# Patient Record
Sex: Female | Born: 1980 | Race: White | Hispanic: No | Marital: Married | Smoking: Never smoker
Health system: Southern US, Community
[De-identification: ages and names within clinical notes are randomized; demographics above are authoritative.]

## PROBLEM LIST (undated history)

## (undated) DIAGNOSIS — G40909 Epilepsy, unspecified, not intractable, without status epilepticus: Secondary | ICD-10-CM

## (undated) DIAGNOSIS — G43909 Migraine, unspecified, not intractable, without status migrainosus: Secondary | ICD-10-CM

## (undated) HISTORY — DX: Epilepsy, unspecified, not intractable, without status epilepticus: G40.909

## (undated) HISTORY — PX: NO PAST SURGERIES: SHX2092

## (undated) HISTORY — DX: Migraine, unspecified, not intractable, without status migrainosus: G43.909

---

## 2011-05-16 VITALS — BP 120/80 | HR 64 | Temp 98.7°F | Ht 62.5 in | Wt 160.0 lb

## 2011-05-16 DIAGNOSIS — Z0000 Encounter for general adult medical examination without abnormal findings: Secondary | ICD-10-CM

## 2011-05-16 DIAGNOSIS — Z136 Encounter for screening for cardiovascular disorders: Secondary | ICD-10-CM

## 2011-05-16 DIAGNOSIS — Z1159 Encounter for screening for other viral diseases: Secondary | ICD-10-CM | POA: Insufficient documentation

## 2011-05-16 DIAGNOSIS — G43909 Migraine, unspecified, not intractable, without status migrainosus: Secondary | ICD-10-CM | POA: Insufficient documentation

## 2011-05-16 DIAGNOSIS — Z01419 Encounter for gynecological examination (general) (routine) without abnormal findings: Secondary | ICD-10-CM | POA: Insufficient documentation

## 2011-05-16 DIAGNOSIS — G40909 Epilepsy, unspecified, not intractable, without status epilepticus: Secondary | ICD-10-CM | POA: Insufficient documentation

## 2011-05-16 DIAGNOSIS — Z8669 Personal history of other diseases of the nervous system and sense organs: Secondary | ICD-10-CM | POA: Insufficient documentation

## 2011-05-16 MED ORDER — RIZATRIPTAN BENZOATE 10 MG PO TABS: 10.0000 mg | ORAL_TABLET | Freq: Once | ORAL | Status: DC | PRN

## 2011-05-16 MED ORDER — LEVONORGESTREL-ETHINYL ESTRAD 0.1-20 MG-MCG PO TABS: 1.0000 | ORAL_TABLET | Freq: Every day | ORAL | Status: DC

## 2012-05-19 VITALS — BP 122/70 | HR 68 | Temp 97.9°F | Ht 62.5 in | Wt 159.0 lb

## 2012-05-19 DIAGNOSIS — Z0000 Encounter for general adult medical examination without abnormal findings: Secondary | ICD-10-CM

## 2012-05-19 DIAGNOSIS — G43909 Migraine, unspecified, not intractable, without status migrainosus: Secondary | ICD-10-CM

## 2012-05-19 DIAGNOSIS — Z136 Encounter for screening for cardiovascular disorders: Secondary | ICD-10-CM

## 2012-05-19 DIAGNOSIS — G40909 Epilepsy, unspecified, not intractable, without status epilepticus: Secondary | ICD-10-CM

## 2012-10-02 VITALS — BP 122/80 | HR 80 | Temp 98.8°F | Wt 162.0 lb

## 2012-10-02 DIAGNOSIS — Z3490 Encounter for supervision of normal pregnancy, unspecified, unspecified trimester: Secondary | ICD-10-CM | POA: Insufficient documentation

## 2012-10-02 DIAGNOSIS — N912 Amenorrhea, unspecified: Secondary | ICD-10-CM

## 2013-05-20 DIAGNOSIS — O4290 Premature rupture of membranes, unspecified as to length of time between rupture and onset of labor, unspecified weeks of gestation: Principal | ICD-10-CM | POA: Diagnosis present

## 2013-05-20 MED ORDER — OXYTOCIN 40 UNITS IN LACTATED RINGERS INFUSION - SIMPLE MED: 62.5000 mL/h | INTRAVENOUS | Status: DC

## 2013-05-20 MED ORDER — LACTATED RINGERS IV SOLN
500.0000 mL | Freq: Once | INTRAVENOUS | Status: AC
  Administered 2013-05-20: 500 mL via INTRAVENOUS

## 2013-05-20 MED ORDER — PHENYLEPHRINE 40 MCG/ML (10ML) SYRINGE FOR IV PUSH (FOR BLOOD PRESSURE SUPPORT): 80.0000 ug | PREFILLED_SYRINGE | INTRAVENOUS | Status: DC | PRN

## 2013-05-20 MED ORDER — ACETAMINOPHEN 325 MG PO TABS: 650.0000 mg | ORAL_TABLET | ORAL | Status: DC | PRN

## 2013-05-20 MED ORDER — SODIUM BICARBONATE 8.4 % IV SOLN
INTRAVENOUS | Status: DC | PRN
  Administered 2013-05-20: 5 mL via EPIDURAL

## 2013-05-20 MED ORDER — CITRIC ACID-SODIUM CITRATE 334-500 MG/5ML PO SOLN: 30.0000 mL | ORAL | Status: DC | PRN

## 2013-05-20 MED ORDER — FENTANYL 2.5 MCG/ML BUPIVACAINE 1/10 % EPIDURAL INFUSION (WH - ANES)
14.0000 mL/h | INTRAMUSCULAR | Status: DC | PRN
  Administered 2013-05-20: 14 mL/h via EPIDURAL

## 2013-05-20 MED ORDER — LACTATED RINGERS IV SOLN
INTRAVENOUS | Status: DC
  Administered 2013-05-20: 19:00:00 via INTRAVENOUS

## 2013-05-20 MED ORDER — LIDOCAINE HCL (PF) 1 % IJ SOLN
30.0000 mL | INTRAMUSCULAR | Status: DC | PRN
  Administered 2013-05-21: 30 mL via SUBCUTANEOUS

## 2013-05-20 MED ORDER — EPHEDRINE 5 MG/ML INJ: 10.0000 mg | INTRAVENOUS | Status: DC | PRN

## 2013-05-20 MED ORDER — LACTATED RINGERS IV SOLN: 500.0000 mL | INTRAVENOUS | Status: DC | PRN

## 2013-05-20 MED ORDER — TERBUTALINE SULFATE 1 MG/ML IJ SOLN: 0.2500 mg | Freq: Once | INTRAMUSCULAR | Status: AC | PRN

## 2013-05-20 MED ORDER — OXYCODONE-ACETAMINOPHEN 5-325 MG PO TABS: 1.0000 | ORAL_TABLET | ORAL | Status: DC | PRN

## 2013-05-20 MED ORDER — ONDANSETRON HCL 4 MG/2ML IJ SOLN: 4.0000 mg | Freq: Four times a day (QID) | INTRAMUSCULAR | Status: DC | PRN

## 2013-05-20 MED ORDER — IBUPROFEN 600 MG PO TABS: 600.0000 mg | ORAL_TABLET | Freq: Four times a day (QID) | ORAL | Status: DC | PRN

## 2013-05-20 MED ORDER — OXYTOCIN 40 UNITS IN LACTATED RINGERS INFUSION - SIMPLE MED: INTRAVENOUS | Status: AC

## 2013-05-20 MED ORDER — OXYTOCIN BOLUS FROM INFUSION
500.0000 mL | INTRAVENOUS | Status: DC
  Administered 2013-05-21: 500 mL via INTRAVENOUS

## 2013-05-20 MED ORDER — BUTORPHANOL TARTRATE 1 MG/ML IJ SOLN
1.0000 mg | INTRAMUSCULAR | Status: DC | PRN
  Administered 2013-05-20: 1 mg via INTRAVENOUS

## 2013-05-20 MED ORDER — OXYTOCIN 40 UNITS IN LACTATED RINGERS INFUSION - SIMPLE MED
1.0000 m[IU]/min | INTRAVENOUS | Status: DC
  Administered 2013-05-20: 2 m[IU]/min via INTRAVENOUS

## 2013-05-20 MED ORDER — DIPHENHYDRAMINE HCL 50 MG/ML IJ SOLN: 12.5000 mg | INTRAMUSCULAR | Status: DC | PRN

## 2013-05-21 MED ORDER — DIPHENHYDRAMINE HCL 25 MG PO CAPS: 25.0000 mg | ORAL_CAPSULE | Freq: Four times a day (QID) | ORAL | Status: DC | PRN

## 2013-05-21 MED ORDER — SENNOSIDES-DOCUSATE SODIUM 8.6-50 MG PO TABS
2.0000 | ORAL_TABLET | Freq: Every day | ORAL | Status: DC
  Administered 2013-05-22 (×2): 2 via ORAL

## 2013-05-21 MED ORDER — IBUPROFEN 600 MG PO TABS
600.0000 mg | ORAL_TABLET | Freq: Four times a day (QID) | ORAL | Status: DC
  Administered 2013-05-21 – 2013-05-23 (×10): 600 mg via ORAL

## 2013-05-21 MED ORDER — ONDANSETRON HCL 4 MG/2ML IJ SOLN: 4.0000 mg | INTRAMUSCULAR | Status: DC | PRN

## 2013-05-21 MED ORDER — MEASLES, MUMPS & RUBELLA VAC ~~LOC~~ INJ
0.5000 mL | INJECTION | Freq: Once | SUBCUTANEOUS | Status: AC
  Administered 2013-05-22: 0.5 mL via SUBCUTANEOUS

## 2013-05-21 MED ORDER — SIMETHICONE 80 MG PO CHEW: 80.0000 mg | CHEWABLE_TABLET | ORAL | Status: DC | PRN

## 2013-05-21 MED ORDER — BENZOCAINE-MENTHOL 20-0.5 % EX AERO
1.0000 "application " | INHALATION_SPRAY | CUTANEOUS | Status: DC | PRN
  Administered 2013-05-21: 1 via TOPICAL

## 2013-05-21 MED ORDER — MEDROXYPROGESTERONE ACETATE 150 MG/ML IM SUSP: 150.0000 mg | INTRAMUSCULAR | Status: DC | PRN

## 2013-05-21 MED ORDER — DIBUCAINE 1 % RE OINT
1.0000 "application " | TOPICAL_OINTMENT | RECTAL | Status: DC | PRN
  Administered 2013-05-21: 1 via RECTAL

## 2013-05-21 MED ORDER — LANOLIN HYDROUS EX OINT: TOPICAL_OINTMENT | CUTANEOUS | Status: DC | PRN

## 2013-05-21 MED ORDER — OXYCODONE-ACETAMINOPHEN 5-325 MG PO TABS: 1.0000 | ORAL_TABLET | ORAL | Status: DC | PRN

## 2013-05-21 MED ORDER — ONDANSETRON HCL 4 MG PO TABS: 4.0000 mg | ORAL_TABLET | ORAL | Status: DC | PRN

## 2013-05-21 MED ORDER — WITCH HAZEL-GLYCERIN EX PADS: 1.0000 "application " | MEDICATED_PAD | CUTANEOUS | Status: DC | PRN

## 2013-05-21 MED ORDER — TETANUS-DIPHTH-ACELL PERTUSSIS 5-2.5-18.5 LF-MCG/0.5 IM SUSP: 0.5000 mL | Freq: Once | INTRAMUSCULAR | Status: DC

## 2013-05-21 MED ORDER — PRENATAL MULTIVITAMIN CH
1.0000 | ORAL_TABLET | Freq: Every day | ORAL | Status: DC
  Administered 2013-05-21 – 2013-05-23 (×3): 1 via ORAL

## 2013-10-10 VITALS — BP 116/70 | HR 76 | Temp 98.8°F | Resp 18 | Wt 148.0 lb

## 2013-10-10 DIAGNOSIS — R112 Nausea with vomiting, unspecified: Secondary | ICD-10-CM | POA: Insufficient documentation

## 2013-10-10 MED ORDER — DIPHENOXYLATE-ATROPINE 2.5-0.025 MG PO TABS: 1.0000 | ORAL_TABLET | Freq: Four times a day (QID) | ORAL | Status: DC | PRN

## 2013-10-10 MED ORDER — ONDANSETRON HCL 4 MG PO TABS: 4.0000 mg | ORAL_TABLET | Freq: Three times a day (TID) | ORAL | Status: DC | PRN

## 2013-10-10 MED ORDER — OSELTAMIVIR PHOSPHATE 75 MG PO CAPS: 75.0000 mg | ORAL_CAPSULE | Freq: Two times a day (BID) | ORAL | Status: DC

## 2014-12-17 DIAGNOSIS — Z3490 Encounter for supervision of normal pregnancy, unspecified, unspecified trimester: Secondary | ICD-10-CM

## 2014-12-17 DIAGNOSIS — Z3A40 40 weeks gestation of pregnancy: Secondary | ICD-10-CM | POA: Diagnosis present

## 2014-12-17 DIAGNOSIS — G40909 Epilepsy, unspecified, not intractable, without status epilepticus: Secondary | ICD-10-CM | POA: Diagnosis present

## 2014-12-17 DIAGNOSIS — O99354 Diseases of the nervous system complicating childbirth: Secondary | ICD-10-CM | POA: Diagnosis present

## 2014-12-17 MED ORDER — MEDROXYPROGESTERONE ACETATE 150 MG/ML IM SUSP: 150.0000 mg | INTRAMUSCULAR | Status: DC | PRN

## 2014-12-17 MED ORDER — MEASLES, MUMPS & RUBELLA VAC ~~LOC~~ INJ: 0.5000 mL | INJECTION | Freq: Once | SUBCUTANEOUS | Status: DC

## 2014-12-17 MED ORDER — OXYCODONE-ACETAMINOPHEN 5-325 MG PO TABS: 2.0000 | ORAL_TABLET | ORAL | Status: DC | PRN

## 2014-12-17 MED ORDER — SENNOSIDES-DOCUSATE SODIUM 8.6-50 MG PO TABS
2.0000 | ORAL_TABLET | ORAL | Status: DC
  Administered 2014-12-17 – 2014-12-18 (×2): 2 via ORAL

## 2014-12-17 MED ORDER — TETANUS-DIPHTH-ACELL PERTUSSIS 5-2.5-18.5 LF-MCG/0.5 IM SUSP: 0.5000 mL | Freq: Once | INTRAMUSCULAR | Status: DC

## 2014-12-17 MED ORDER — CITRIC ACID-SODIUM CITRATE 334-500 MG/5ML PO SOLN: 30.0000 mL | ORAL | Status: DC | PRN

## 2014-12-17 MED ORDER — ZOLPIDEM TARTRATE 5 MG PO TABS: 5.0000 mg | ORAL_TABLET | Freq: Every evening | ORAL | Status: DC | PRN

## 2014-12-17 MED ORDER — OXYCODONE-ACETAMINOPHEN 5-325 MG PO TABS: 1.0000 | ORAL_TABLET | ORAL | Status: DC | PRN

## 2014-12-17 MED ORDER — EPHEDRINE 5 MG/ML INJ: 10.0000 mg | INTRAVENOUS | Status: DC | PRN

## 2014-12-17 MED ORDER — LACTATED RINGERS IV SOLN: 500.0000 mL | Freq: Once | INTRAVENOUS | Status: DC

## 2014-12-17 MED ORDER — IBUPROFEN 600 MG PO TABS
600.0000 mg | ORAL_TABLET | Freq: Four times a day (QID) | ORAL | Status: DC
  Administered 2014-12-17 – 2014-12-19 (×8): 600 mg via ORAL

## 2014-12-17 MED ORDER — SIMETHICONE 80 MG PO CHEW: 80.0000 mg | CHEWABLE_TABLET | ORAL | Status: DC | PRN

## 2014-12-17 MED ORDER — DIPHENHYDRAMINE HCL 50 MG/ML IJ SOLN: 12.5000 mg | INTRAMUSCULAR | Status: DC | PRN

## 2014-12-17 MED ORDER — ONDANSETRON HCL 4 MG/2ML IJ SOLN: 4.0000 mg | Freq: Four times a day (QID) | INTRAMUSCULAR | Status: DC | PRN

## 2014-12-17 MED ORDER — ONDANSETRON HCL 4 MG/2ML IJ SOLN: 4.0000 mg | INTRAMUSCULAR | Status: DC | PRN

## 2014-12-17 MED ORDER — TERBUTALINE SULFATE 1 MG/ML IJ SOLN: 0.2500 mg | Freq: Once | INTRAMUSCULAR | Status: DC | PRN

## 2014-12-17 MED ORDER — PRENATAL MULTIVITAMIN CH
1.0000 | ORAL_TABLET | Freq: Every day | ORAL | Status: DC
  Administered 2014-12-18 – 2014-12-19 (×2): 1 via ORAL

## 2014-12-17 MED ORDER — ACETAMINOPHEN 325 MG PO TABS: 650.0000 mg | ORAL_TABLET | ORAL | Status: DC | PRN

## 2014-12-17 MED ORDER — LACTATED RINGERS IV SOLN: 500.0000 mL | INTRAVENOUS | Status: DC | PRN

## 2014-12-17 MED ORDER — FENTANYL 2.5 MCG/ML BUPIVACAINE 1/10 % EPIDURAL INFUSION (WH - ANES)
INTRAMUSCULAR | Status: DC | PRN
  Administered 2014-12-17: 14 mL/h via EPIDURAL

## 2014-12-17 MED ORDER — PHENYLEPHRINE 40 MCG/ML (10ML) SYRINGE FOR IV PUSH (FOR BLOOD PRESSURE SUPPORT): 80.0000 ug | PREFILLED_SYRINGE | INTRAVENOUS | Status: DC | PRN

## 2014-12-17 MED ORDER — DIPHENHYDRAMINE HCL 25 MG PO CAPS: 25.0000 mg | ORAL_CAPSULE | Freq: Four times a day (QID) | ORAL | Status: DC | PRN

## 2014-12-17 MED ORDER — OXYTOCIN BOLUS FROM INFUSION: 500.0000 mL | INTRAVENOUS | Status: DC

## 2014-12-17 MED ORDER — LACTATED RINGERS IV SOLN: INTRAVENOUS | Status: DC

## 2014-12-17 MED ORDER — OXYTOCIN BOLUS FROM INFUSION
500.0000 mL | INTRAVENOUS | Status: DC
  Administered 2014-12-17: 500 mL via INTRAVENOUS

## 2014-12-17 MED ORDER — BUTORPHANOL TARTRATE 1 MG/ML IJ SOLN: 1.0000 mg | INTRAMUSCULAR | Status: DC | PRN

## 2014-12-17 MED ORDER — LIDOCAINE HCL (PF) 1 % IJ SOLN: 30.0000 mL | INTRAMUSCULAR | Status: DC | PRN

## 2014-12-17 MED ORDER — OXYTOCIN 40 UNITS IN LACTATED RINGERS INFUSION - SIMPLE MED
1.0000 m[IU]/min | INTRAVENOUS | Status: DC
  Administered 2014-12-17: 2 m[IU]/min via INTRAVENOUS

## 2014-12-17 MED ORDER — LACTATED RINGERS IV SOLN
INTRAVENOUS | Status: DC
  Administered 2014-12-17: 08:00:00 via INTRAVENOUS

## 2014-12-17 MED ORDER — OXYTOCIN 40 UNITS IN LACTATED RINGERS INFUSION - SIMPLE MED: 62.5000 mL/h | INTRAVENOUS | Status: DC

## 2014-12-17 MED ORDER — ONDANSETRON HCL 4 MG/2ML IJ SOLN
4.0000 mg | Freq: Four times a day (QID) | INTRAMUSCULAR | Status: DC | PRN
  Administered 2014-12-17: 4 mg via INTRAVENOUS

## 2014-12-17 MED ORDER — ONDANSETRON HCL 4 MG PO TABS: 4.0000 mg | ORAL_TABLET | ORAL | Status: DC | PRN

## 2014-12-17 MED ORDER — WITCH HAZEL-GLYCERIN EX PADS
1.0000 "application " | MEDICATED_PAD | CUTANEOUS | Status: DC | PRN
  Administered 2014-12-17: 1 via TOPICAL

## 2014-12-17 MED ORDER — PHENYLEPHRINE 40 MCG/ML (10ML) SYRINGE FOR IV PUSH (FOR BLOOD PRESSURE SUPPORT)
80.0000 ug | PREFILLED_SYRINGE | INTRAVENOUS | Status: DC | PRN
  Administered 2014-12-17: 80 ug via INTRAVENOUS

## 2014-12-17 MED ORDER — BENZOCAINE-MENTHOL 20-0.5 % EX AERO
1.0000 "application " | INHALATION_SPRAY | CUTANEOUS | Status: DC | PRN
  Administered 2014-12-17: 1 via TOPICAL

## 2014-12-17 MED ORDER — DIBUCAINE 1 % RE OINT: 1.0000 "application " | TOPICAL_OINTMENT | RECTAL | Status: DC | PRN

## 2014-12-17 MED ADMIN — Lidocaine HCl Local Preservative Free (PF) Inj 1%: 4 mL | NDC 00409427902

## 2015-04-14 VITALS — BP 126/72 | HR 58 | Temp 97.8°F | Ht 62.0 in | Wt 153.2 lb

## 2015-04-14 DIAGNOSIS — Z0000 Encounter for general adult medical examination without abnormal findings: Secondary | ICD-10-CM

## 2015-11-25 VITALS — BP 120/76 | HR 109 | Temp 99.0°F | Ht 62.0 in | Wt 162.4 lb

## 2015-11-25 DIAGNOSIS — R05 Cough: Secondary | ICD-10-CM | POA: Diagnosis not present

## 2015-11-25 DIAGNOSIS — R059 Cough, unspecified: Secondary | ICD-10-CM

## 2015-11-25 MED ORDER — AZITHROMYCIN 250 MG PO TABS: ORAL_TABLET | ORAL | Status: DC

## 2016-04-17 VITALS — BP 110/66 | HR 70 | Temp 98.3°F | Ht 62.25 in | Wt 167.0 lb

## 2016-04-17 DIAGNOSIS — Z0000 Encounter for general adult medical examination without abnormal findings: Secondary | ICD-10-CM

## 2016-04-17 DIAGNOSIS — G40909 Epilepsy, unspecified, not intractable, without status epilepticus: Secondary | ICD-10-CM | POA: Diagnosis not present

## 2016-09-04 VITALS — BP 112/60 | HR 74 | Temp 98.3°F | Wt 161.5 lb

## 2016-09-04 DIAGNOSIS — J069 Acute upper respiratory infection, unspecified: Secondary | ICD-10-CM | POA: Diagnosis not present

## 2016-09-04 MED ORDER — LEVOCETIRIZINE DIHYDROCHLORIDE 5 MG PO TABS: 5.0000 mg | ORAL_TABLET | Freq: Every evening | ORAL | 3 refills | Status: DC

## 2016-09-04 MED ORDER — AMOXICILLIN-POT CLAVULANATE 875-125 MG PO TABS: 1.0000 | ORAL_TABLET | Freq: Two times a day (BID) | ORAL | 0 refills | Status: AC

## 2016-12-18 VITALS — BP 110/82 | HR 86 | Ht 61.0 in | Wt 159.0 lb

## 2016-12-18 DIAGNOSIS — Z4802 Encounter for removal of sutures: Secondary | ICD-10-CM | POA: Diagnosis not present

## 2016-12-31 DIAGNOSIS — M62838 Other muscle spasm: Secondary | ICD-10-CM | POA: Diagnosis not present

## 2016-12-31 MED ORDER — CYCLOBENZAPRINE HCL 5 MG PO TABS: 5.0000 mg | ORAL_TABLET | Freq: Three times a day (TID) | ORAL | 1 refills | Status: DC | PRN

## 2017-01-03 VITALS — BP 112/74 | HR 78 | Wt 163.2 lb

## 2017-01-03 DIAGNOSIS — R202 Paresthesia of skin: Secondary | ICD-10-CM | POA: Diagnosis not present

## 2017-01-03 DIAGNOSIS — M62838 Other muscle spasm: Secondary | ICD-10-CM

## 2017-01-03 MED ORDER — PREDNISONE 20 MG PO TABS: 40.0000 mg | ORAL_TABLET | Freq: Every day | ORAL | 0 refills | Status: DC

## 2017-04-18 VITALS — BP 100/72 | HR 72 | Ht 63.0 in | Wt 161.0 lb

## 2017-04-18 DIAGNOSIS — N920 Excessive and frequent menstruation with regular cycle: Secondary | ICD-10-CM | POA: Insufficient documentation

## 2017-04-18 DIAGNOSIS — Z113 Encounter for screening for infections with a predominantly sexual mode of transmission: Secondary | ICD-10-CM | POA: Insufficient documentation

## 2017-04-18 DIAGNOSIS — Z124 Encounter for screening for malignant neoplasm of cervix: Secondary | ICD-10-CM | POA: Diagnosis not present

## 2017-04-18 DIAGNOSIS — Z0000 Encounter for general adult medical examination without abnormal findings: Secondary | ICD-10-CM | POA: Diagnosis not present

## 2017-04-18 DIAGNOSIS — N921 Excessive and frequent menstruation with irregular cycle: Secondary | ICD-10-CM

## 2017-04-18 DIAGNOSIS — Z01419 Encounter for gynecological examination (general) (routine) without abnormal findings: Secondary | ICD-10-CM | POA: Insufficient documentation

## 2017-04-18 MED ORDER — LEVONORGESTREL-ETHINYL ESTRAD 0.1-20 MG-MCG PO TABS: 1.0000 | ORAL_TABLET | Freq: Every day | ORAL | 11 refills | Status: DC

## 2017-12-27 VITALS — BP 106/70 | HR 73 | Temp 98.7°F | Wt 163.0 lb

## 2017-12-27 DIAGNOSIS — J302 Other seasonal allergic rhinitis: Secondary | ICD-10-CM

## 2017-12-27 DIAGNOSIS — R17 Unspecified jaundice: Secondary | ICD-10-CM | POA: Diagnosis not present

## 2017-12-27 DIAGNOSIS — N921 Excessive and frequent menstruation with irregular cycle: Secondary | ICD-10-CM | POA: Diagnosis not present

## 2017-12-27 DIAGNOSIS — E7800 Pure hypercholesterolemia, unspecified: Secondary | ICD-10-CM

## 2017-12-27 DIAGNOSIS — Z7689 Persons encountering health services in other specified circumstances: Secondary | ICD-10-CM

## 2018-04-25 VITALS — BP 102/66 | HR 75 | Temp 98.3°F | Ht 62.25 in | Wt 161.0 lb

## 2018-04-25 DIAGNOSIS — E7800 Pure hypercholesterolemia, unspecified: Secondary | ICD-10-CM | POA: Diagnosis not present

## 2018-04-25 DIAGNOSIS — N921 Excessive and frequent menstruation with irregular cycle: Secondary | ICD-10-CM

## 2018-04-25 DIAGNOSIS — R17 Unspecified jaundice: Secondary | ICD-10-CM | POA: Diagnosis not present

## 2018-04-25 DIAGNOSIS — Z0000 Encounter for general adult medical examination without abnormal findings: Secondary | ICD-10-CM | POA: Diagnosis not present

## 2018-06-26 DIAGNOSIS — J302 Other seasonal allergic rhinitis: Secondary | ICD-10-CM

## 2018-08-27 VITALS — BP 124/70 | HR 91 | Ht 62.25 in | Wt 168.0 lb

## 2018-08-27 DIAGNOSIS — S86111A Strain of other muscle(s) and tendon(s) of posterior muscle group at lower leg level, right leg, initial encounter: Secondary | ICD-10-CM | POA: Insufficient documentation

## 2018-08-27 MED ORDER — MELOXICAM 15 MG PO TABS: 15.0000 mg | ORAL_TABLET | Freq: Every day | ORAL | 0 refills | Status: DC

## 2018-08-27 MED ORDER — TRAMADOL HCL 50 MG PO TABS: ORAL_TABLET | ORAL | 0 refills | Status: DC

## 2018-10-21 VITALS — BP 108/70 | HR 79 | Temp 98.3°F | Wt 170.0 lb

## 2018-10-21 DIAGNOSIS — R3915 Urgency of urination: Secondary | ICD-10-CM | POA: Diagnosis not present

## 2018-10-21 DIAGNOSIS — R350 Frequency of micturition: Secondary | ICD-10-CM | POA: Diagnosis not present

## 2018-10-21 DIAGNOSIS — N3000 Acute cystitis without hematuria: Secondary | ICD-10-CM | POA: Diagnosis not present

## 2018-10-21 DIAGNOSIS — R300 Dysuria: Secondary | ICD-10-CM | POA: Diagnosis not present

## 2018-10-21 MED ORDER — NITROFURANTOIN MONOHYD MACRO 100 MG PO CAPS: 100.0000 mg | ORAL_CAPSULE | Freq: Two times a day (BID) | ORAL | 0 refills | Status: DC

## 2018-12-28 DIAGNOSIS — J302 Other seasonal allergic rhinitis: Secondary | ICD-10-CM

## 2019-01-07 DIAGNOSIS — Z1239 Encounter for other screening for malignant neoplasm of breast: Secondary | ICD-10-CM

## 2019-04-10 DIAGNOSIS — Z1239 Encounter for other screening for malignant neoplasm of breast: Secondary | ICD-10-CM

## 2019-05-04 DIAGNOSIS — Z0000 Encounter for general adult medical examination without abnormal findings: Secondary | ICD-10-CM

## 2019-05-04 DIAGNOSIS — N921 Excessive and frequent menstruation with irregular cycle: Secondary | ICD-10-CM | POA: Diagnosis not present

## 2019-05-04 DIAGNOSIS — E559 Vitamin D deficiency, unspecified: Secondary | ICD-10-CM

## 2019-05-04 DIAGNOSIS — E7800 Pure hypercholesterolemia, unspecified: Secondary | ICD-10-CM

## 2019-06-30 DIAGNOSIS — Z23 Encounter for immunization: Secondary | ICD-10-CM | POA: Diagnosis not present

## 2019-11-27 DIAGNOSIS — Z1231 Encounter for screening mammogram for malignant neoplasm of breast: Secondary | ICD-10-CM

## 2020-04-15 DIAGNOSIS — Z1231 Encounter for screening mammogram for malignant neoplasm of breast: Secondary | ICD-10-CM

## 2020-04-18 DIAGNOSIS — Z1231 Encounter for screening mammogram for malignant neoplasm of breast: Secondary | ICD-10-CM

## 2020-05-04 DIAGNOSIS — Z0000 Encounter for general adult medical examination without abnormal findings: Secondary | ICD-10-CM

## 2020-05-04 DIAGNOSIS — Z1159 Encounter for screening for other viral diseases: Secondary | ICD-10-CM

## 2020-05-04 DIAGNOSIS — J3089 Other allergic rhinitis: Secondary | ICD-10-CM

## 2020-05-04 DIAGNOSIS — E7889 Other lipoprotein metabolism disorders: Secondary | ICD-10-CM | POA: Diagnosis not present

## 2020-05-04 DIAGNOSIS — R0683 Snoring: Secondary | ICD-10-CM

## 2020-05-04 MED ORDER — FLUTICASONE PROPIONATE 50 MCG/ACT NA SUSP: 2.0000 | Freq: Every day | NASAL | 6 refills | Status: DC

## 2020-12-12 DIAGNOSIS — Z1231 Encounter for screening mammogram for malignant neoplasm of breast: Secondary | ICD-10-CM

## 2021-04-24 DIAGNOSIS — Z1231 Encounter for screening mammogram for malignant neoplasm of breast: Secondary | ICD-10-CM

## 2021-04-27 DIAGNOSIS — Z1231 Encounter for screening mammogram for malignant neoplasm of breast: Secondary | ICD-10-CM

## 2021-05-09 DIAGNOSIS — J3089 Other allergic rhinitis: Secondary | ICD-10-CM

## 2021-05-09 MED ORDER — FLUTICASONE PROPIONATE 50 MCG/ACT NA SUSP: 2.0000 | Freq: Every day | NASAL | 0 refills | Status: DC

## 2021-05-19 DIAGNOSIS — T7840XA Allergy, unspecified, initial encounter: Secondary | ICD-10-CM | POA: Diagnosis not present

## 2021-05-19 DIAGNOSIS — Z13220 Encounter for screening for lipoid disorders: Secondary | ICD-10-CM | POA: Diagnosis not present

## 2021-05-19 DIAGNOSIS — E559 Vitamin D deficiency, unspecified: Secondary | ICD-10-CM | POA: Diagnosis not present

## 2021-05-19 DIAGNOSIS — N921 Excessive and frequent menstruation with irregular cycle: Secondary | ICD-10-CM | POA: Diagnosis not present

## 2021-05-19 DIAGNOSIS — Z130 Encounter for screening for diseases of the blood and blood-forming organs and certain disorders involving the immune mechanism: Secondary | ICD-10-CM

## 2021-05-19 DIAGNOSIS — Z8669 Personal history of other diseases of the nervous system and sense organs: Secondary | ICD-10-CM

## 2021-05-19 DIAGNOSIS — G43809 Other migraine, not intractable, without status migrainosus: Secondary | ICD-10-CM

## 2021-08-10 MED ORDER — LEVOCETIRIZINE DIHYDROCHLORIDE 5 MG PO TABS: 2.5000 mg | ORAL_TABLET | Freq: Every evening | ORAL | 3 refills | Status: DC

## 2021-08-13 MED ORDER — LEVOCETIRIZINE DIHYDROCHLORIDE 5 MG PO TABS: 2.5000 mg | ORAL_TABLET | Freq: Every evening | ORAL | 3 refills | Status: DC

## 2021-12-31 IMAGING — MG DIGITAL SCREENING BILAT W/ TOMO W/ CAD
8 series · 8 of 24 positions shown · non-contrast
Comparison: Previous exam(s).

CLINICAL DATA: Screening.

EXAM:
DIGITAL SCREENING BILATERAL MAMMOGRAM WITH TOMO AND CAD

[L CC synth-2D]
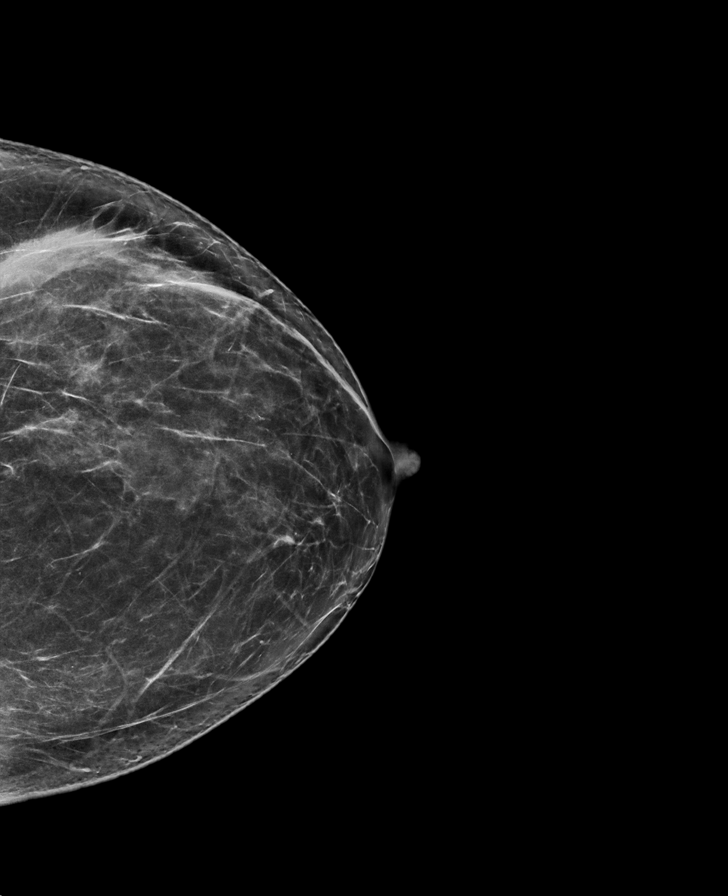

[R CC synth-2D]
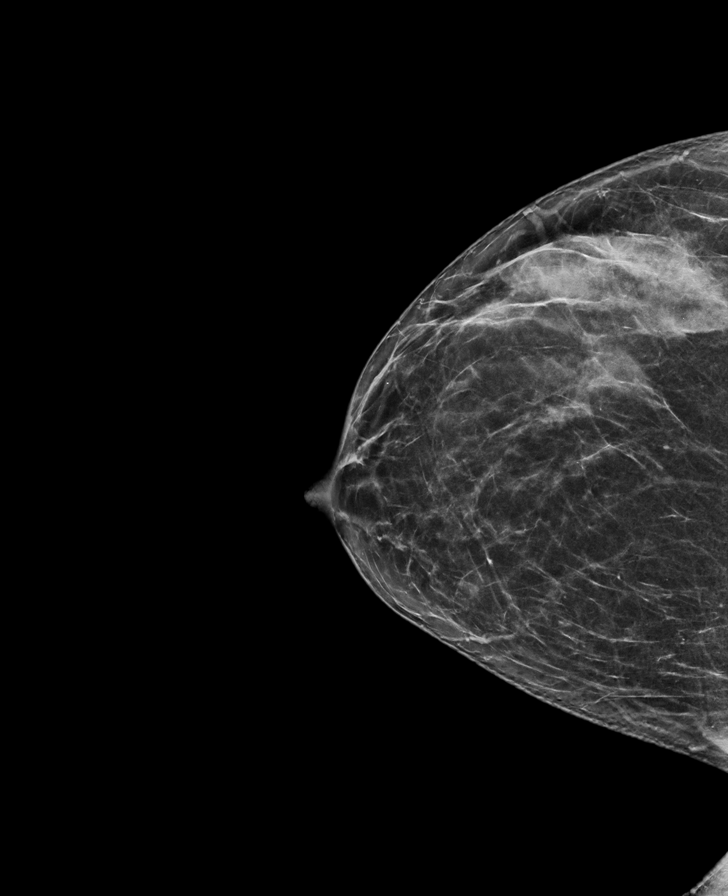

[L MLO synth-2D]
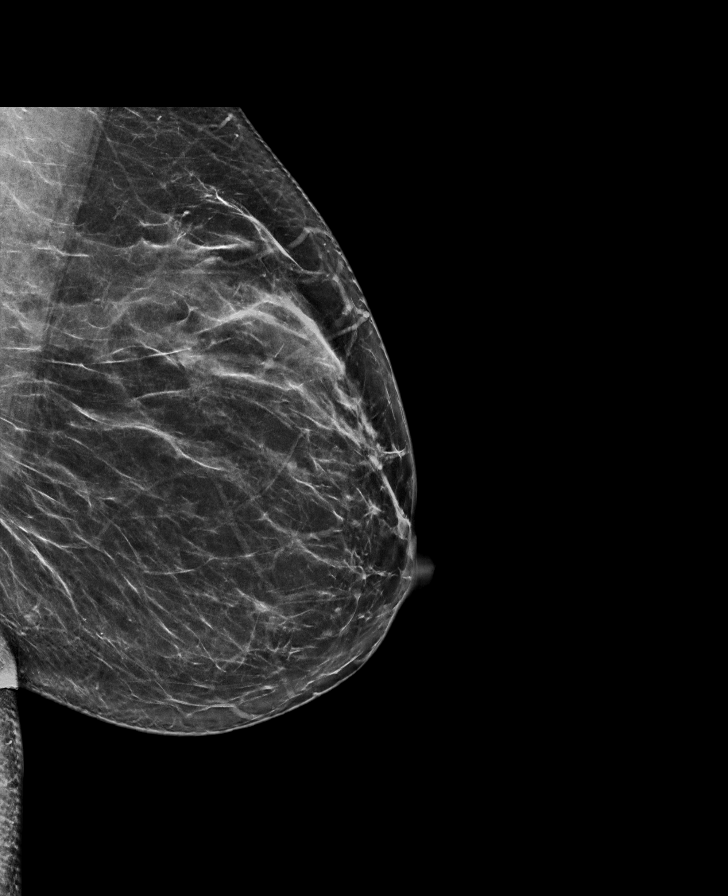

[R MLO synth-2D]
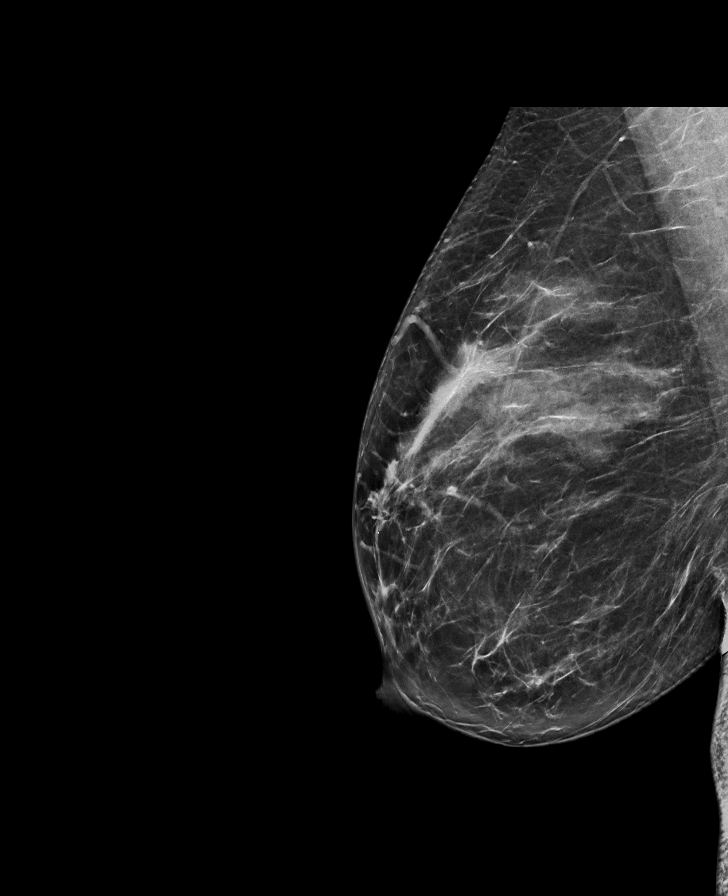

[L CC tomo · tomo slice 39/77.0]
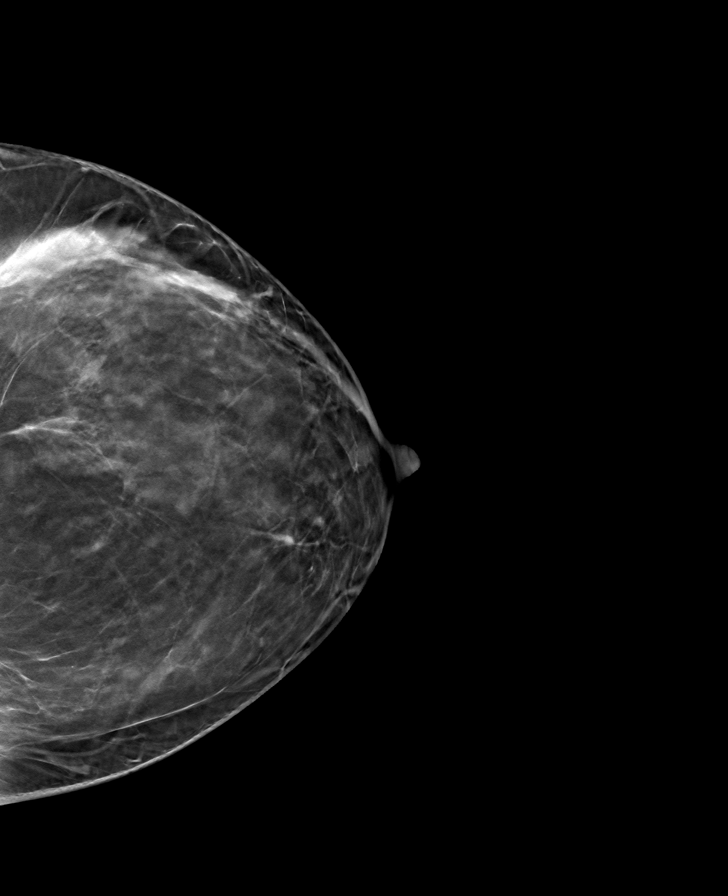

[L MLO tomo · tomo slice 39/77.0]
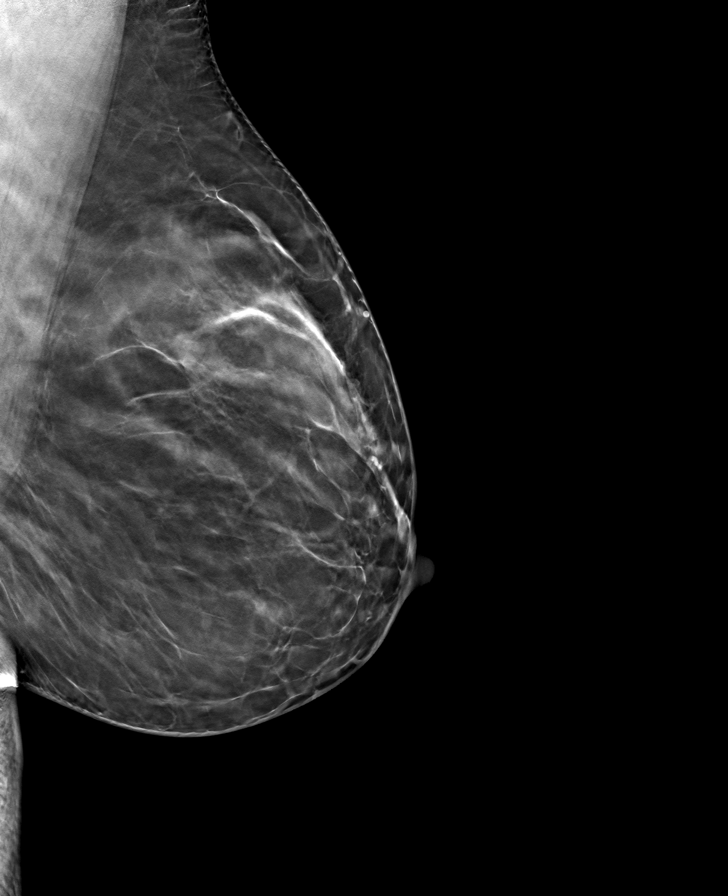

[R CC tomo · tomo slice 35/70.0]
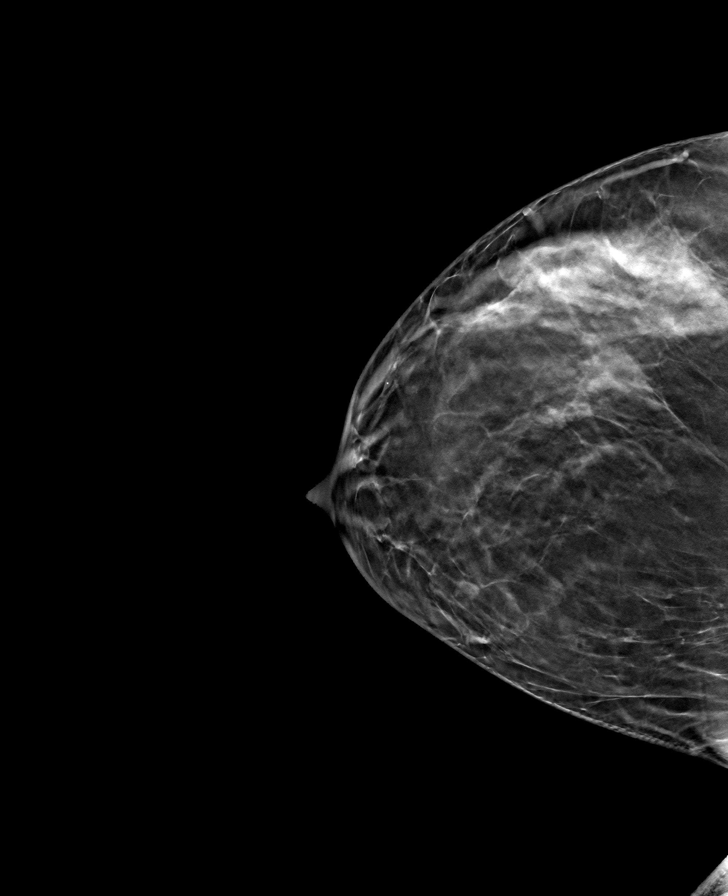

[R MLO tomo · tomo slice 39/78.0]
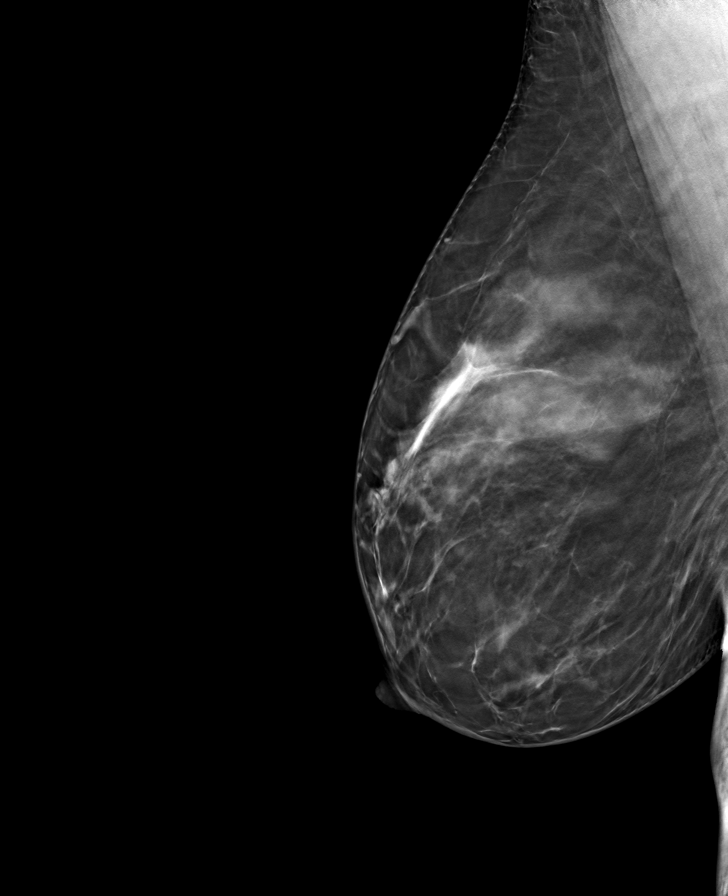

[8 of 24 positions shown; findings below may reference images not displayed]

ACR Breast Density Category b: There are scattered areas of
fibroglandular density.
FINDINGS: There are no findings suspicious for malignancy. Images were
processed with CAD.
IMPRESSION: No mammographic evidence of malignancy. A result letter of this
screening mammogram will be mailed directly to the patient.

RECOMMENDATION:
Screening mammogram in one year. (Code:[id number])

BI-RADS CATEGORY  1: Negative.

## 2022-03-08 DIAGNOSIS — R300 Dysuria: Secondary | ICD-10-CM | POA: Diagnosis not present

## 2022-03-08 MED ORDER — SULFAMETHOXAZOLE-TRIMETHOPRIM 800-160 MG PO TABS: 1.0000 | ORAL_TABLET | Freq: Two times a day (BID) | ORAL | 0 refills | Status: AC

## 2022-04-23 DIAGNOSIS — Z1231 Encounter for screening mammogram for malignant neoplasm of breast: Secondary | ICD-10-CM

## 2022-05-02 DIAGNOSIS — Z1231 Encounter for screening mammogram for malignant neoplasm of breast: Secondary | ICD-10-CM

## 2022-05-03 DIAGNOSIS — Z13220 Encounter for screening for lipoid disorders: Secondary | ICD-10-CM

## 2022-05-03 DIAGNOSIS — N926 Irregular menstruation, unspecified: Secondary | ICD-10-CM

## 2022-05-03 DIAGNOSIS — E559 Vitamin D deficiency, unspecified: Secondary | ICD-10-CM

## 2022-05-11 DIAGNOSIS — Z13220 Encounter for screening for lipoid disorders: Secondary | ICD-10-CM | POA: Diagnosis not present

## 2022-05-11 DIAGNOSIS — N926 Irregular menstruation, unspecified: Secondary | ICD-10-CM | POA: Diagnosis not present

## 2022-05-11 DIAGNOSIS — E559 Vitamin D deficiency, unspecified: Secondary | ICD-10-CM | POA: Diagnosis not present

## 2022-05-22 DIAGNOSIS — Z0000 Encounter for general adult medical examination without abnormal findings: Secondary | ICD-10-CM | POA: Diagnosis not present

## 2022-05-22 DIAGNOSIS — Z23 Encounter for immunization: Secondary | ICD-10-CM

## 2022-05-22 MED ORDER — VITAMIN D3 1.25 MG (50000 UT) PO CAPS: 1.0000 | ORAL_CAPSULE | ORAL | 0 refills | Status: DC

## 2022-05-24 DIAGNOSIS — J3089 Other allergic rhinitis: Secondary | ICD-10-CM

## 2022-05-24 MED ORDER — FLUTICASONE PROPIONATE 50 MCG/ACT NA SUSP: 2.0000 | Freq: Every day | NASAL | 3 refills | Status: DC

## 2023-03-04 DIAGNOSIS — Z1231 Encounter for screening mammogram for malignant neoplasm of breast: Secondary | ICD-10-CM

## 2023-05-06 DIAGNOSIS — Z1231 Encounter for screening mammogram for malignant neoplasm of breast: Secondary | ICD-10-CM

## 2023-06-25 DIAGNOSIS — E559 Vitamin D deficiency, unspecified: Secondary | ICD-10-CM

## 2023-06-25 DIAGNOSIS — Z13220 Encounter for screening for lipoid disorders: Secondary | ICD-10-CM

## 2023-07-12 DIAGNOSIS — E559 Vitamin D deficiency, unspecified: Secondary | ICD-10-CM | POA: Diagnosis not present

## 2023-07-12 DIAGNOSIS — Z13220 Encounter for screening for lipoid disorders: Secondary | ICD-10-CM

## 2023-07-19 DIAGNOSIS — Z0000 Encounter for general adult medical examination without abnormal findings: Secondary | ICD-10-CM | POA: Diagnosis not present

## 2024-03-31 DIAGNOSIS — Z1231 Encounter for screening mammogram for malignant neoplasm of breast: Secondary | ICD-10-CM

## 2024-05-06 DIAGNOSIS — Z1231 Encounter for screening mammogram for malignant neoplasm of breast: Secondary | ICD-10-CM

## 2024-05-12 DIAGNOSIS — Z13220 Encounter for screening for lipoid disorders: Secondary | ICD-10-CM

## 2024-05-12 DIAGNOSIS — E559 Vitamin D deficiency, unspecified: Secondary | ICD-10-CM

## 2024-05-12 NOTE — Telephone Encounter (Signed)
 Please call and schedule fasting lab appointment prior to her CPE on 08/26/2024.  Future orders in [facility].

## 2024-08-11 DIAGNOSIS — E559 Vitamin D deficiency, unspecified: Secondary | ICD-10-CM | POA: Diagnosis not present

## 2024-08-11 DIAGNOSIS — Z13220 Encounter for screening for lipoid disorders: Secondary | ICD-10-CM

## 2024-08-14 DIAGNOSIS — J3089 Other allergic rhinitis: Secondary | ICD-10-CM

## 2024-08-14 DIAGNOSIS — E871 Hypo-osmolality and hyponatremia: Secondary | ICD-10-CM

## 2024-08-14 DIAGNOSIS — Z1211 Encounter for screening for malignant neoplasm of colon: Secondary | ICD-10-CM | POA: Diagnosis not present

## 2024-08-14 DIAGNOSIS — Z0000 Encounter for general adult medical examination without abnormal findings: Secondary | ICD-10-CM

## 2024-08-14 DIAGNOSIS — Z1283 Encounter for screening for malignant neoplasm of skin: Secondary | ICD-10-CM

## 2024-08-14 MED ORDER — VITAMIN D (ERGOCALCIFEROL) 1.25 MG (50000 UNIT) PO CAPS: 50000.0000 [IU] | ORAL_CAPSULE | ORAL | 0 refills | Status: AC

## 2024-08-14 MED ORDER — FLUTICASONE PROPIONATE 50 MCG/ACT NA SUSP: 2.0000 | Freq: Every day | NASAL | 3 refills | Status: AC

## 2024-10-02 DIAGNOSIS — Z1231 Encounter for screening mammogram for malignant neoplasm of breast: Secondary | ICD-10-CM

## 2024-10-16 MED ORDER — OSELTAMIVIR PHOSPHATE 75 MG PO CAPS: 75.0000 mg | ORAL_CAPSULE | Freq: Every day | ORAL | 0 refills | Status: AC
